# Patient Record
Sex: Male | Born: 1985 | Race: White | Hispanic: No | Marital: Single | Smoking: Never smoker
Health system: Southern US, Community
[De-identification: ages and names within clinical notes are randomized; demographics above are authoritative.]

---

## 2011-09-08 ENCOUNTER — Emergency Department (HOSPITAL_BASED_OUTPATIENT_CLINIC_OR_DEPARTMENT_OTHER): Payer: BC Managed Care – PPO

## 2011-09-08 ENCOUNTER — Emergency Department (HOSPITAL_BASED_OUTPATIENT_CLINIC_OR_DEPARTMENT_OTHER)
Admission: EM | Admit: 2011-09-08 | Discharge: 2011-09-08 | Disposition: A | Discharge status: Home / Self Care | Payer: BC Managed Care – PPO | Attending: Emergency Medicine | Admitting: Emergency Medicine

## 2011-09-08 ENCOUNTER — Encounter (HOSPITAL_BASED_OUTPATIENT_CLINIC_OR_DEPARTMENT_OTHER): Payer: Self-pay | Admitting: *Deleted

## 2011-09-08 DIAGNOSIS — W010XXA Fall on same level from slipping, tripping and stumbling without subsequent striking against object, initial encounter: Secondary | ICD-10-CM | POA: Insufficient documentation

## 2011-09-08 DIAGNOSIS — S43016A Anterior dislocation of unspecified humerus, initial encounter: Secondary | ICD-10-CM | POA: Insufficient documentation

## 2011-09-08 DIAGNOSIS — S43015A Anterior dislocation of left humerus, initial encounter: Secondary | ICD-10-CM

## 2011-09-08 MED ORDER — FENTANYL CITRATE 0.05 MG/ML IJ SOLN
100.0000 ug | Freq: Once | INTRAMUSCULAR | Status: AC
  Administered 2011-09-08: 100 ug via INTRAVENOUS

## 2011-09-08 MED ORDER — SODIUM CHLORIDE 0.9 % IV SOLN: INTRAVENOUS | Status: DC

## 2011-09-08 MED ORDER — FENTANYL CITRATE 0.05 MG/ML IJ SOLN
INTRAMUSCULAR | Status: AC
  Administered 2011-09-08: 100 ug via INTRAVENOUS
  Filled 2011-09-08: qty 2

## 2011-09-08 MED ORDER — SODIUM CHLORIDE 0.9 % IV BOLUS (SEPSIS)
500.0000 mL | Freq: Once | INTRAVENOUS | Status: AC
  Administered 2011-09-08: 500 mL via INTRAVENOUS

## 2011-09-08 NOTE — Discharge Instructions (Signed)
Shoulder Dislocation A dislocated shoulder means that the bones of the shoulder joint are out of place. The shoulder needs to be put back in place. It is held in place with a sling or shoulder immobilizer. It usually takes 4 to 6 weeks for the shoulder joint to heal completely. Follow up with your caregiver as directed. Do not use your arm until your caregiver approves. You may remove your sling or shoulder immobilizer to bathe, unless otherwise directed by your caregiver. Limit any movement of your arm while the sling or shoulder immobilizer is removed. You are at an increased risk of additional dislocations, especially if you move your shoulder too much before it is healed. A shoulder that has been dislocated several times may require surgery to tighten up the joint in order to prevent dislocations in the future. Nerves around the joint can also be damaged with this injury. This is not common. Watch for signs of nerve damage.  SEEK IMMEDIATE MEDICAL CARE IF: You have numbness or weakness in the injured arm or hand, uncontrolled pain, or other concerns such as color change to the left hand or arm. Document Released: 03/06/2005 Document Revised: 02/23/2011 Document Reviewed: 08/16/2009 [facility] Patient Information 2012 [place], [place].

## 2011-09-08 NOTE — ED Provider Notes (Signed)
History     CSN: [id number]  Arrival date & time 09/08/11  4098   First MD Initiated Contact with Patient 09/08/11 1913      Chief Complaint  Patient presents with  . Shoulder Injury    (Consider location/radiation/quality/duration/timing/severity/associated sxs/prior treatment) HPI This right-hand-dominant 26 year old male [place] just prior to arrival injuring his left shoulder with deformity with skin intact. He has isolated left shoulder deformity consistent with dislocated shoulder. He is no head or neck injury. His last oral intake was 2:00 this afternoon. He is no recent illnesses. He has no cough chest pain shortness of breath abdominal pain neck pain or back pain. He has no distal weakness or numbness to the left arm. He is no injury to his right arm or his legs. He is moderately severe pain to the left shoulder only without pain to the left elbow wrist or hand. He has not had a dislocated shoulder in the past. Pain is localized without radiation worse with movement better if he stays still and no treatment prior to arrival. History reviewed. No pertinent past medical history.  History reviewed. No pertinent past surgical history.  No family history on file.  History  Substance Use Topics  . Smoking status: Never Smoker   . Smokeless tobacco: Not on file  . Alcohol Use: Yes      Review of Systems  Constitutional: Negative for fever.       10 Systems reviewed and are negative for acute change except as noted in the HPI.  HENT: Negative for congestion.   Eyes: Negative for discharge and redness.  Respiratory: Negative for cough and shortness of breath.   Cardiovascular: Negative for chest pain.  Gastrointestinal: Negative for vomiting and abdominal pain.  Musculoskeletal: Negative for back pain.  Skin: Negative for rash.  Neurological: Negative for syncope, numbness and headaches.  Psychiatric/Behavioral:       No behavior change.    Allergies   Review of patient's allergies indicates no known allergies.  Home Medications   Current Outpatient Rx  Name Route Sig Dispense Refill  . ACETAMINOPHEN 325 MG PO TABS Oral Take 650 mg by mouth every 6 (six) hours as needed. Patient used this medication for pain.      BP 157/77  Pulse 88  Temp 98.6 F (37 C) (Oral)  Resp 18  SpO2 100%  Physical Exam  Nursing note and vitals reviewed. Constitutional:       Awake, alert, nontoxic appearance.  HENT:  Head: Atraumatic.  Mouth/Throat: Oropharynx is clear and moist.  Eyes: Pupils are equal, round, and reactive to light. Right eye exhibits no discharge. Left eye exhibits no discharge.  Neck: Neck supple.  Cardiovascular: Normal rate and regular rhythm.   No murmur heard. Pulmonary/Chest: Effort normal and breath sounds normal. No respiratory distress. He has no wheezes. He has no rales. He exhibits no tenderness.  Abdominal: Soft. There is no tenderness. There is no rebound.  Musculoskeletal: He exhibits tenderness.       Baseline ROM legs and right arm, no obvious new focal weakness except limited exam to the left shoulder due to the deformity with injury. Cervical spine and back are nontender. His left arm is obvious deformity with skin intact at the shoulder consistent with a dislocation. His left elbow wrist and hand are nontender. He has normal light touch over his left shoulder deltoid region. He has normal light touch was intact image durations of his median, radial,  and ulnar nerve function to his left hand. He has capillary refill less than 2 seconds in all digits to the left hand including radial pulses intact to the left wrist.  Neurological: He is alert.       Mental status and motor strength appears baseline for patient and situation.  Skin: No rash noted.  Psychiatric: He has a normal mood and affect.    ED Course  Procedures (including critical care time) Timeout taken, with gentle manipulation with massage of the left  biceps and shoulder as well as gentle pendulum movements of the left upper arm the patient's anterior shoulder reduction was easily reduced with one attempt by myself without the need for procedural sedation. The patient tolerated this well with no apparent immediate complications. Labs Reviewed - No data to display No results found.   1. Dislocation of shoulder, anterior, left, closed       MDM  Pt stable in ED with no significant deterioration in condition.Patient / Family / Caregiver informed of clinical course, understand medical decision-making process, and agree with plan.        Hurman Horn, MD 09/14/11 [phone]

## 2011-09-08 NOTE — ED Notes (Signed)
Left shoulder injury. He slipped [place] a creek. Deformity noted.

## 2013-03-24 ENCOUNTER — Encounter (HOSPITAL_COMMUNITY): Payer: Self-pay | Admitting: Emergency Medicine

## 2013-03-24 ENCOUNTER — Emergency Department (INDEPENDENT_AMBULATORY_CARE_PROVIDER_SITE_OTHER): Payer: Self-pay

## 2013-03-24 ENCOUNTER — Emergency Department (INDEPENDENT_AMBULATORY_CARE_PROVIDER_SITE_OTHER)
Admission: EM | Admit: 2013-03-24 | Discharge: 2013-03-24 | Disposition: A | Discharge status: Home / Self Care | Payer: Self-pay | Source: Home / Self Care | Attending: Emergency Medicine | Admitting: Emergency Medicine

## 2013-03-24 DIAGNOSIS — J111 Influenza due to unidentified influenza virus with other respiratory manifestations: Secondary | ICD-10-CM

## 2013-03-24 DIAGNOSIS — R69 Illness, unspecified: Principal | ICD-10-CM

## 2013-03-24 LAB — POCT RAPID STREP A: STREPTOCOCCUS, GROUP A SCREEN (DIRECT): NEGATIVE

## 2013-03-24 MED ORDER — TRAMADOL HCL 50 MG PO TABS: 100.0000 mg | ORAL_TABLET | Freq: Three times a day (TID) | ORAL | Status: AC | PRN

## 2013-03-24 MED ORDER — GUAIFENESIN-CODEINE 100-10 MG/5ML PO SYRP: 10.0000 mL | ORAL_SOLUTION | Freq: Four times a day (QID) | ORAL | Status: AC | PRN

## 2013-03-24 NOTE — ED Notes (Signed)
C/o flu like syx

## 2013-03-24 NOTE — Discharge Instructions (Signed)
Most upper respiratory infections are caused by viruses and do not require antibiotics.  We try to save the antibiotics for when we really need them to avoid resistance.  This does not mean that there is nothing that can be done.  Here are a few hints about things that can be done at home to get over an upper respiratory infection quicker: ° °Get extra sleep and extra fluids.  Get 7 to 9 hours of sleep per night and 6 to 8 glasses of water a day.  Getting extra sleep keeps the immune system from getting run down.  Most people with an upper respiratory infection are a little dehydrated.  The extra fluids also keep the secretions liquified and easier to deal with.  Also, get extra vitamin C.  4000 mg per day is the recommended dose. °For the aches, headache, and fever, acetaminophen or ibuprofen are helpful.  These can be alternated every 4 hours.  People with liver disease should avoid large amounts of acetaminophen, and people with ulcer disease, gastroesophageal reflux, gastritis, congestive heart failure, chronic kidney disease, coronary artery disease and the elderly should avoid ibuprofen. °For nasal congestion try Mucinex-D, or if you're having lots of sneezing or copious clear nasal drainage use Allegra-D, Claritin-D, or Zyrtec-D. People with high blood pressure can take these if their blood pressure is controlled, if not, it's best to avoid the forms with a "D" (decongestants).  You can use the plain Mucinex, Allegra, Claritin, or Zyrtec even if your blood pressure is not controlled.   °A Saline nasal spray such as Ocean Spray can also help as can decongestant sprays such as Afrin, but you should not use the decongestant sprays for more than 3 or 4 days since they can be habituating.  If nasal dryness is a problem, Ayr Nasal Gel can help moisturize your nasal passages.  Breath Rite nasal strips can also offer a non-drug alternative treatment to nasal congestion, especially at night. °For people with symptoms  of sinusitis, sleeping with your head elevated can be helpful.  For sinus pain, moist, hot compresses to the face may provide some relief.  Many people find that inhaling steam as in a shower or from a pot of steaming water can help. °For sore throat, zinc containing lozenges such as Cold-Eze or Zicam are helpful.  Zinc helps to fight infection and has a mild astringent effect that relieves the sore, achey throat.  Hot salt water gargles (8 oz of hot water, 1/2 tsp of table salt, and a pinch of baking soda) can give relief as well as hot beverages such as hot tea. °For the cough, old time remedies such as honey or honey and lemon are tried and true.  Over the counter cough syrups such as Delsym 2 tsp every 12 hours can help as well.  It has also been found recently that Aleve can help control a cough.  The dose is 1 to 2 tablets twice daily with food.  This can be combined with Delsym. (Note, if you are taking ibuprofen, you should not take Aleve as well--take one or the other.) °A cool mist vaporizer will help keep your mucous membranes from drying out.  ° °It's important when you have an upper respiratory infection not to pass the infection to others.  This involves being very careful about the following: ° °Frequent hand washing or use of hand sanitizer, especially after coughing, sneezing, blowing your nose or touching your face, nose or eyes. °Do not   shake hands or touch anyone and try to avoid touching surfaces that other people use such as doorknobs, shopping carts, telephones and computer keyboards. Use tissues and dispose of them properly in a garbage can or ziplock bag. Cough into your sleeve. Do not let others eat or drink after you.  It's also important to recognize the signs of serious illness and get evaluated if they occur: Any respiratory infection that lasts more than 7 to 10 days.  Yellow nasal drainage and sputum are not reliable indicators of a bacterial infection, but if they last for more  than 1 week, see your doctor. Fever and sore throat can indicate strep. Fever and cough can indicate influenza or pneumonia. Any kind of severe symptom such as difficulty breathing, intractable vomiting, or severe pain should prompt you to see a doctor as soon as possible.   Your body's immune system is really the thing that will get rid of this infection.  Your immune system is comprised of 2 types of specialized cells called T cells and B cells.  T cells coordinate the array of cells in your body that engulf invading bacteria or viruses while B cells orchestrate the production of antibodies that neutralize infection.  Anything we do or any medications we give you, will just strengthen your immune system or help it clear up the infection quicker.  Here are a few helpful hints to improve your immune system to help overcome this illness or to prevent future infections:  A few vitamins can improve the health of your immune system.  That's why your diet should include plenty of fruits, vegetables, fish, nuts, and whole grains.  Vitamin A and bet-carotene can increase the cells that fight infections (T cells and B cells).  Vitamin A is abundant in dark greens and orange vegetables such as spinach, greens, sweet potatoes, and carrots.  Vitamin B6 contributes to the maturation of white blood cells, the cells that fight disease.  Foods with vitamin B6 include cold cereal and bananas.  Vitamin C is credited with preventing colds because it increases white blood cells and also prevents cellular damage.  Citrus fruits, peaches and green and red bell peppers are all hight in vitamin C.  Vitamin E is an anti-oxidant that encourages the production of natural killer cells which reject foreign invaders and B cells that produce antibodies.  Foods high in vitamin E include wheat germ, nuts and seeds.  Foods high in omega-3 fatty acids found in foods like salmon, tuna and mackerel boost your immune system and help  cells to engulf and absorb germs.  Probiotics are good bacteria that increase your T cells.  These can be found in yogurt and are available in supplements such as Culturelle or Align.  Moderate exercise increases the strength of your immune system and your ability to recover from illness.  I suggest 3 to 5 moderate intensity 30 minute workouts per week.    Sleep is another component of maintaining a strong immune system.  It enables your body to recuperate from the day's activities, stress and work.  My recommendation is to get between 7 and 9 hours of sleep per night.  If you smoke, try to quit completely or at least cut down.  Drink alcohol only in moderation if at all.  No more than 2 drinks daily for men or 1 for women.  Get a flu vaccine early in the fall or if you have not gotten one yet, once this illness has run  its course.  If you are over 65, a smoker, or an asthmatic, get a pneumococcal vaccine.  My final recommendation is to maintain a healthy weight.  Excess weight can impair the immune system by interfering with the way the immune system deals with invading viruses or bacteria.   Influenza, Adult Influenza ("the flu") is a viral infection of the respiratory tract. It occurs more often in winter months because people spend more time in close contact with one another. Influenza can make you feel very sick. Influenza easily spreads from person to person (contagious). CAUSES  Influenza is caused by a virus that infects the respiratory tract. You can catch the virus by breathing in droplets from an infected person's cough or sneeze. You can also catch the virus by touching something that was recently contaminated with the virus and then touching your mouth, nose, or eyes. SYMPTOMS  Symptoms typically last 4 to 10 days and may include: Fever. Chills. Headache, body aches, and muscle aches. Sore throat. Chest discomfort and cough. Poor appetite. Weakness or feeling  tired. Dizziness. Nausea or vomiting. DIAGNOSIS  Diagnosis of influenza is often made based on your history and a physical exam. A nose or throat swab test can be done to confirm the diagnosis. RISKS AND COMPLICATIONS You may be at risk for a more severe case of influenza if you smoke cigarettes, have diabetes, have chronic heart disease (such as heart failure) or lung disease (such as asthma), or if you have a weakened immune system. Elderly people and pregnant women are also at risk for more serious infections. The most common complication of influenza is a lung infection (pneumonia). Sometimes, this complication can require emergency medical care and may be life-threatening. PREVENTION  An annual influenza vaccination (flu shot) is the best way to avoid getting influenza. An annual flu shot is now routinely recommended for all adults in the [place] TREATMENT  In mild cases, influenza goes away on its own. Treatment is directed at relieving symptoms. For more severe cases, your caregiver may prescribe antiviral medicines to shorten the sickness. Antibiotic medicines are not effective, because the infection is caused by a virus, not by bacteria. HOME CARE INSTRUCTIONS Only take over-the-counter or prescription medicines for pain, discomfort, or fever as directed by your caregiver. Use a cool mist humidifier to make breathing easier. Get plenty of rest until your temperature returns to normal. This usually takes 3 to 4 days. Drink enough fluids to keep your urine clear or pale yellow. Cover your mouth and nose when coughing or sneezing, and wash your hands well to avoid spreading the virus. Stay home from work or school until your fever has been gone for at least 1 full day. SEEK MEDICAL CARE IF:  You have chest pain or a deep cough that worsens or produces more mucus. You have nausea, vomiting, or diarrhea. SEEK IMMEDIATE MEDICAL CARE IF:  You have difficulty breathing, shortness of breath, or  your skin or nails turn bluish. You have severe neck pain or stiffness. You have a severe headache, facial pain, or earache. You have a worsening or recurring fever. You have nausea or vomiting that cannot be controlled. MAKE SURE YOU: Understand these instructions. Will watch your condition. Will get help right away if you are not doing well or get worse. Document Released: 03/03/2000 Document Revised: 09/05/2011 Document Reviewed: 06/05/2011 [facility] Patient Information 2014 [place], [place].

## 2013-03-24 NOTE — ED Provider Notes (Signed)
Chief Complaint   Chief Complaint  Patient presents with  . Influenza    History of Present Illness   Paul CofferRussell Splinter is a 28 year old male who has had a two-day history of subjective fever, sweats, chills, aches, muscle cramps, nasal congestion, rhinorrhea, sore throat, and a cough productive of blood-streaked sputum. He denies any difficulty breathing, chest pain, headache, or GI symptoms. He has had no known sick exposures.  Review of Systems   Other than as noted above, the patient denies any of the following symptoms: Systemic:  No fevers, chills, sweats, or myalgias. Eye:  No redness or discharge. ENT:  No ear pain, headache, nasal congestion, drainage, sinus pressure, or sore throat. Neck:  No neck pain, stiffness, or swollen glands. Lungs:  No cough, sputum production, hemoptysis, wheezing, chest tightness, shortness of breath or chest pain. GI:  No abdominal pain, nausea, vomiting or diarrhea.  PMFSH   Past medical history, family history, social history, meds, and allergies were reviewed.  Physical exam   Vital signs:  There were no vitals taken for this visit. General:  Alert and oriented.  In no distress.  Skin warm and dry. Eye:  No conjunctival injection or drainage. Lids were normal. ENT:  TMs and canals were normal, without erythema or inflammation.  Nasal mucosa was clear and uncongested, without drainage.  Mucous membranes were moist.  Pharynx was clear with no exudate or drainage.  There were no oral ulcerations or lesions. Neck:  Supple, no adenopathy, tenderness or mass. Lungs:  No respiratory distress.  Lungs were clear to auscultation, without wheezes, rales or rhonchi.  Breath sounds were clear and equal bilaterally.  Heart:  Regular rhythm, without gallops, murmers or rubs. Skin:  Clear, warm, and dry, without rash or lesions.  Labs   Results for orders placed during the hospital encounter of 03/24/13  POCT RAPID STREP A ([facility]RG CARE ONLY)      Result  Value Range   Streptococcus, Group A Screen (Direct) NEGATIVE  NEGATIVE    Radiology   Dg Chest 2 View  03/24/2013   CLINICAL DATA:  No acute thoracic abnormalities identified.  EXAM: CHEST  2 VIEW  COMPARISON:  None  FINDINGS: Normal heart size, mediastinal contours, and pulmonary vascularity.  Significant pectus excavatum deformity.  No definite infiltrate, pleural effusion or pneumothorax.  No acute osseous findings.  Dextro convex liver as scoliosis mid thoracic spine.  IMPRESSION: Pectus excavatum with thoracic scoliosis.  No acute abnormalities.   Electronically Signed   By: Ulyses SouthwardMark  Boles M.D.   On: 03/24/2013 12:22   Assessment     The encounter diagnosis was Influenza-like illness.  The patient states he cannot afford Tamiflu and does not want it to be prescribed.  Plan    1.  Meds:  The following meds were prescribed:   Discharge Medication List as of 03/24/2013 12:52 PM    START taking these medications   Details  guaiFENesin-codeine (GUIATUSS AC) 100-10 MG/5ML syrup Take 10 mLs by mouth 4 (four) times daily as needed for cough., Starting 03/24/2013, Until Discontinued, Print    traMADol (ULTRAM) 50 MG tablet Take 2 tablets (100 mg total) by mouth every 8 (eight) hours as needed., Starting 03/24/2013, Until Discontinued, Normal        2.  Patient Education/Counseling:  The patient was given appropriate handouts, self care instructions, and instructed in symptomatic relief.  Instructed to get extra fluids, rest, and use a cool mist vaporizer.   3.  Follow  up:  The patient was told to follow up here if no better in 3 to 4 days, or sooner if becoming worse in any way, and given some red flag symptoms such as increasing fever, difficulty breathing, chest pain, or persistent vomiting which would prompt immediate return.  Follow up here as needed.      Reuben Likes, MD 03/24/13 [phone]

## 2013-03-26 LAB — CULTURE, GROUP A STREP

## 2015-07-09 IMAGING — CR DG CHEST 2V
2 series · 2 of 2 positions shown · non-contrast
Comparison: None

CLINICAL DATA: No acute thoracic abnormalities identified.

EXAM:
CHEST  2 VIEW

[view not recorded (1 of 2)]
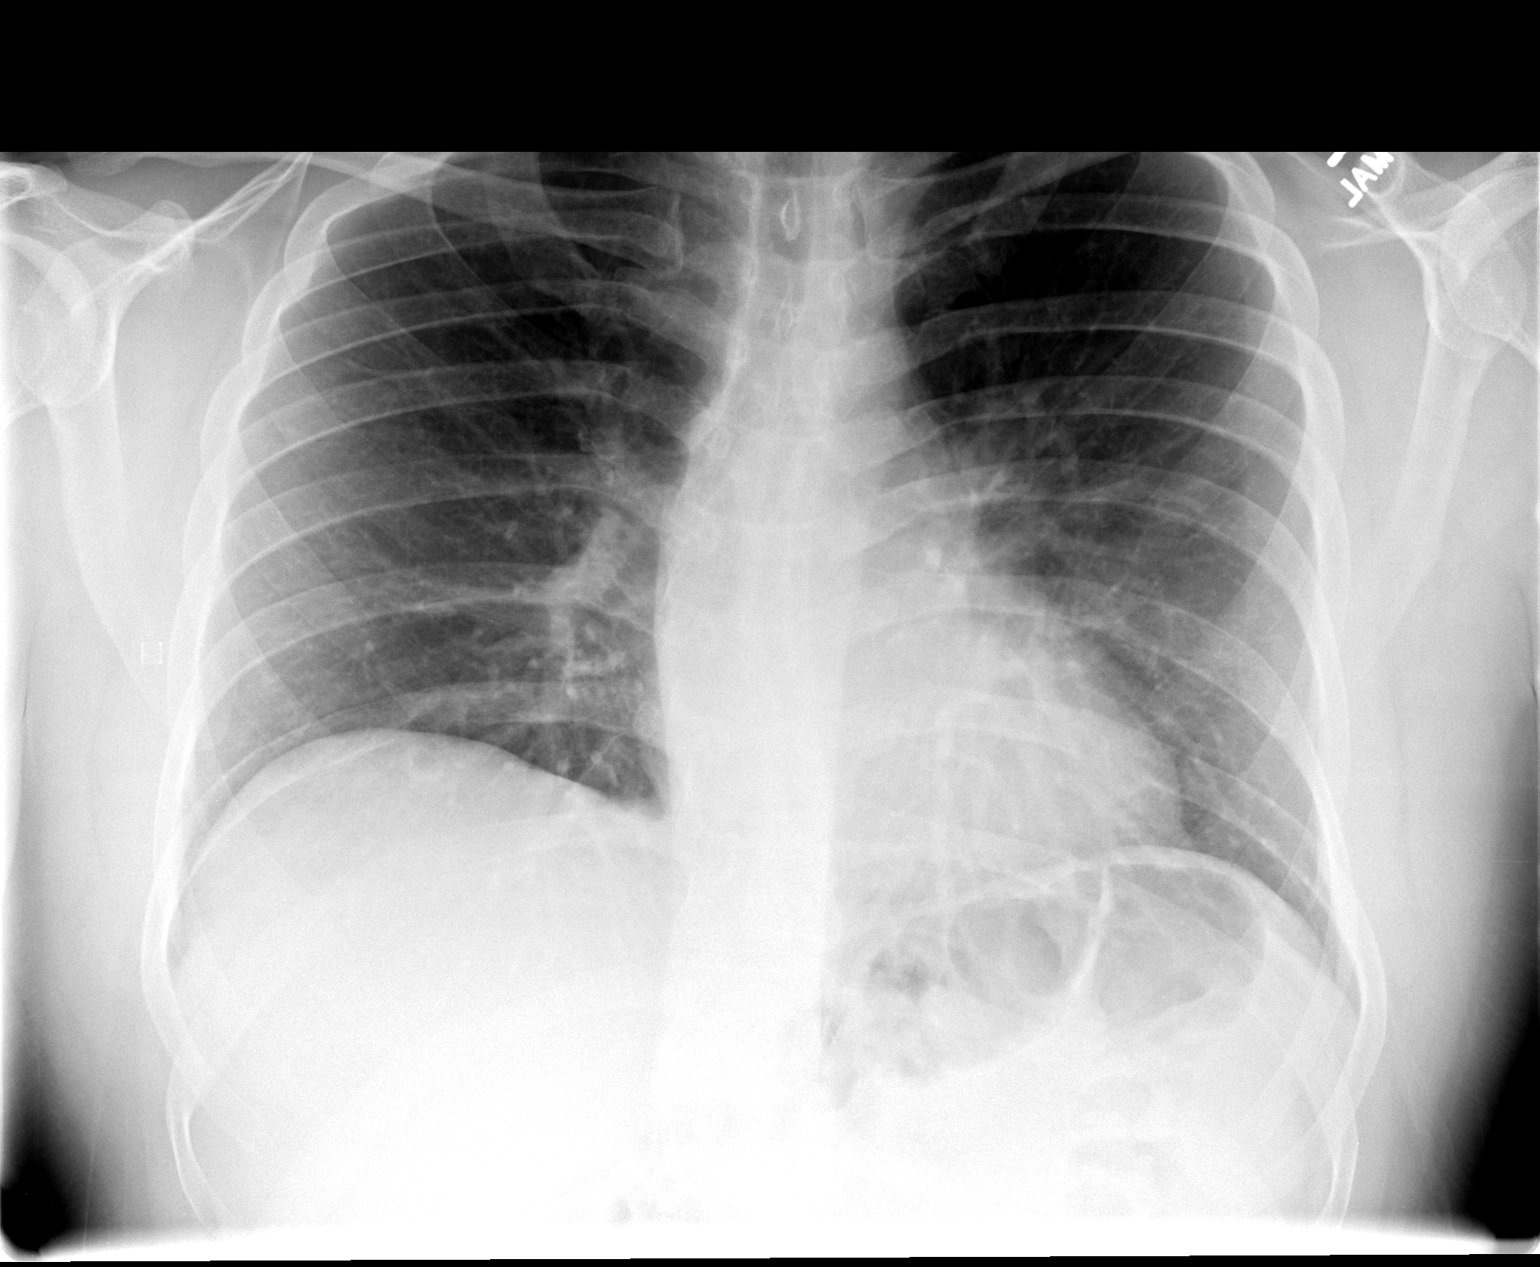

[view not recorded (2 of 2)]
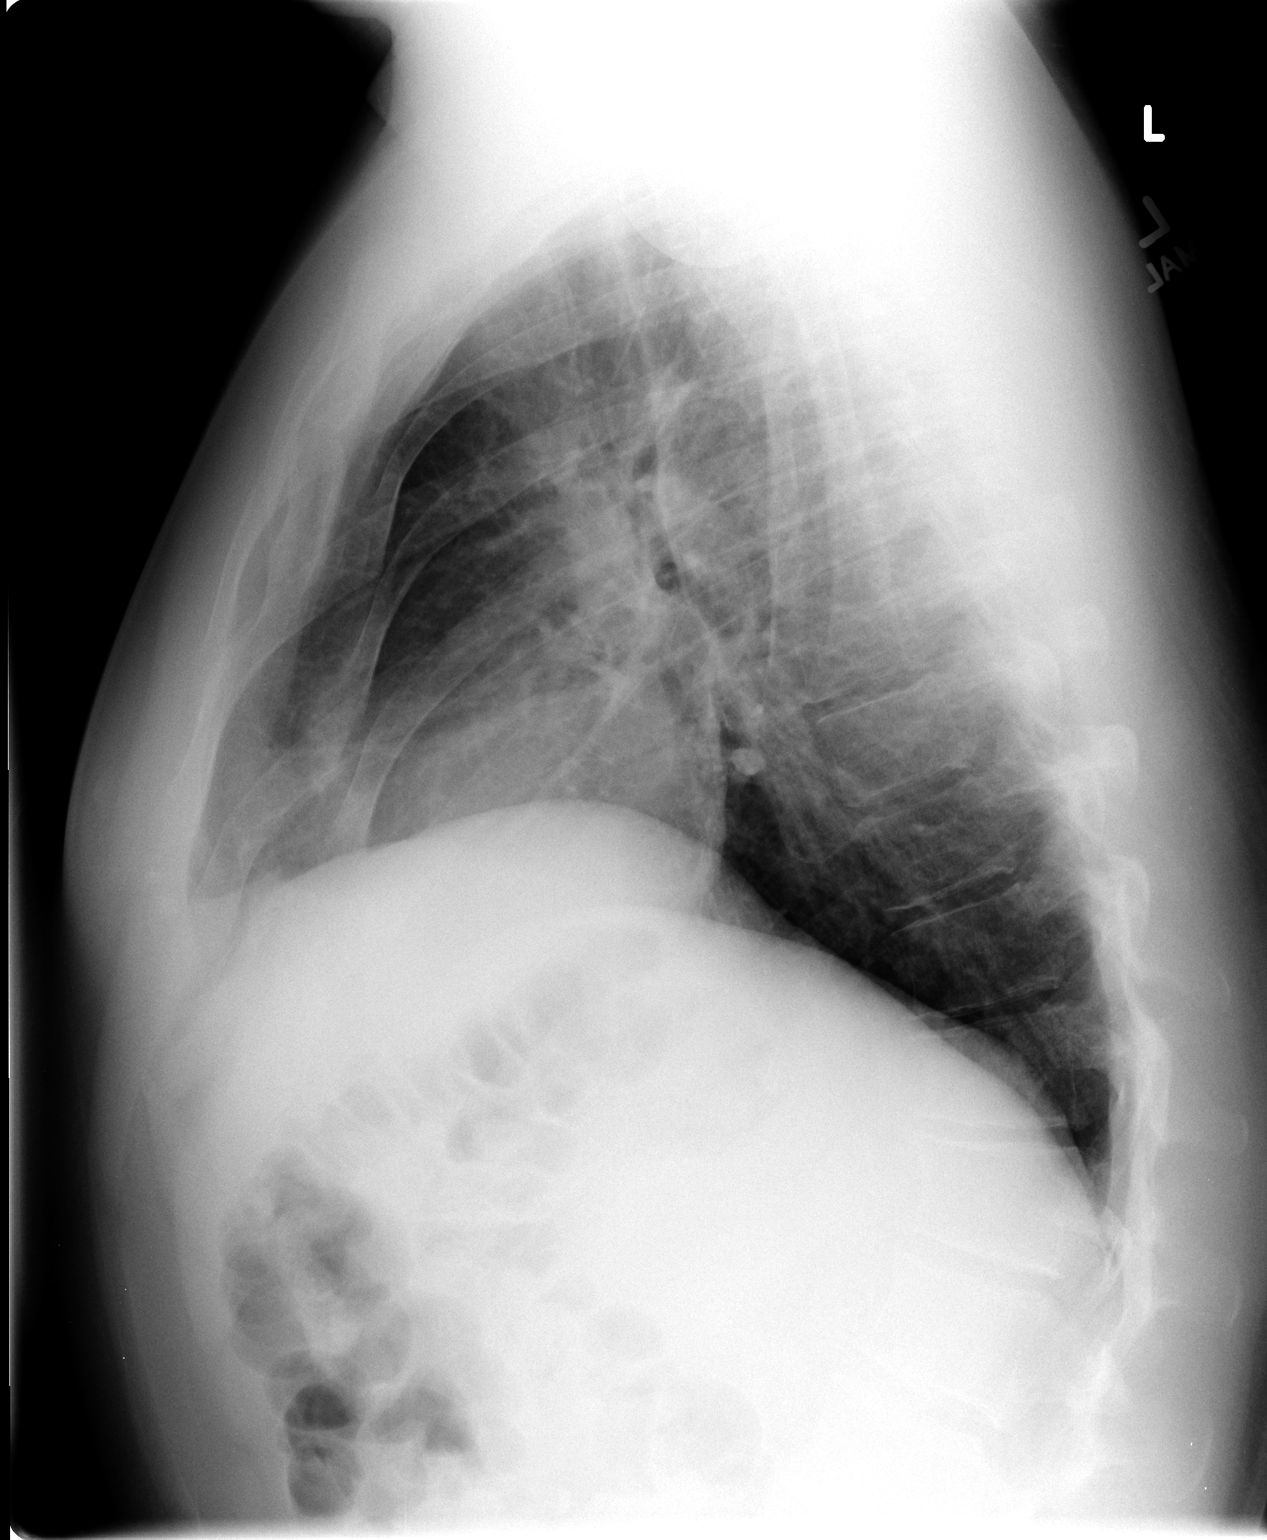

[2 of 2 positions shown; findings below may reference images not displayed]

FINDINGS: Normal heart size, mediastinal contours, and pulmonary vascularity.

Significant pectus excavatum deformity.

No definite infiltrate, pleural effusion or pneumothorax.

No acute osseous findings.

Dextro convex liver as scoliosis mid thoracic spine.
IMPRESSION: Pectus excavatum with thoracic scoliosis.

No acute abnormalities.
